# Patient Record
Sex: Female | Born: 1965 | Race: White | Hispanic: No | Marital: Married
Health system: Southern US, Community
[De-identification: ages and names within clinical notes are randomized; demographics above are authoritative.]

---

## 2015-05-18 ENCOUNTER — Emergency Department (HOSPITAL_COMMUNITY)
Admission: EM | Admit: 2015-05-18 | Discharge: 2015-05-18 | Disposition: A | Discharge status: Home / Self Care | Payer: Worker's Compensation | Attending: Emergency Medicine | Admitting: Emergency Medicine

## 2015-05-18 ENCOUNTER — Encounter (HOSPITAL_COMMUNITY): Payer: Self-pay | Admitting: *Deleted

## 2015-05-18 ENCOUNTER — Emergency Department (HOSPITAL_COMMUNITY): Payer: Worker's Compensation

## 2015-05-18 DIAGNOSIS — Y9389 Activity, other specified: Secondary | ICD-10-CM | POA: Diagnosis not present

## 2015-05-18 DIAGNOSIS — S20212A Contusion of left front wall of thorax, initial encounter: Secondary | ICD-10-CM | POA: Insufficient documentation

## 2015-05-18 DIAGNOSIS — Z792 Long term (current) use of antibiotics: Secondary | ICD-10-CM | POA: Diagnosis not present

## 2015-05-18 DIAGNOSIS — Z79899 Other long term (current) drug therapy: Secondary | ICD-10-CM | POA: Diagnosis not present

## 2015-05-18 DIAGNOSIS — W1839XA Other fall on same level, initial encounter: Secondary | ICD-10-CM | POA: Diagnosis not present

## 2015-05-18 DIAGNOSIS — S81012A Laceration without foreign body, left knee, initial encounter: Secondary | ICD-10-CM | POA: Insufficient documentation

## 2015-05-18 DIAGNOSIS — S299XXA Unspecified injury of thorax, initial encounter: Secondary | ICD-10-CM | POA: Diagnosis present

## 2015-05-18 DIAGNOSIS — Y990 Civilian activity done for income or pay: Secondary | ICD-10-CM | POA: Insufficient documentation

## 2015-05-18 DIAGNOSIS — Y9289 Other specified places as the place of occurrence of the external cause: Secondary | ICD-10-CM | POA: Diagnosis not present

## 2015-05-18 MED ORDER — OXYCODONE-ACETAMINOPHEN 5-325 MG PO TABS
2.0000 | ORAL_TABLET | Freq: Once | ORAL | Status: AC
  Administered 2015-05-18: 2 via ORAL
  Filled 2015-05-18: qty 2

## 2015-05-18 MED ORDER — ONDANSETRON 4 MG PO TBDP
4.0000 mg | ORAL_TABLET | Freq: Once | ORAL | Status: AC
  Administered 2015-05-18: 4 mg via ORAL
  Filled 2015-05-18: qty 1

## 2015-05-18 MED ORDER — OXYCODONE-ACETAMINOPHEN 5-325 MG PO TABS
2.0000 | ORAL_TABLET | ORAL | Status: AC | PRN
Start: 2015-05-18 — End: ?

## 2015-05-18 NOTE — ED Notes (Signed)
Pt is in stable condition upon d/c and is escorted from ED via wheelchair. 

## 2015-05-18 NOTE — Discharge Instructions (Signed)
Percocet for pain.  Use spirometer 10 times each hour while awake.    Chest Contusion A contusion is a deep bruise. Bruises happen when an injury causes bleeding under the skin. Signs of bruising include pain, puffiness (swelling), and discolored skin. The bruise may turn blue, purple, or yellow.  HOME CARE  Put ice on the injured area.  Put ice in a plastic bag.  Place a towel between the skin and the bag.  Leave the ice on for 15-20 minutes at a time, 03-04 times a day for the first 48 hours.  Only take medicine as told by your doctor.  Rest.  Take deep breaths (deep-breathing exercises) as told by your doctor.  Stop smoking if you smoke.  Do not lift objects over 5 pounds (2.3 kilograms) for 3 days or longer if told by your doctor. GET HELP RIGHT AWAY IF:   You have more bruising or puffiness.  You have pain that gets worse.  You have trouble breathing.  You are dizzy, weak, or pass out (faint).  You have blood in your pee (urine) or poop (stool).  You cough up or throw up (vomit) blood.  Your puffiness or pain is not helped with medicines. MAKE SURE YOU:   Understand these instructions.  Will watch your condition.  Will get help right away if you are not doing well or get worse.   This information is not intended to replace advice given to you by your health care provider. Make sure you discuss any questions you have with your health care provider.   Document Released: 11/06/2007 Document Revised: 02/12/2012 Document Reviewed: 11/11/2011 Elsevier Interactive Patient Education [employer]

## 2015-05-18 NOTE — ED Notes (Signed)
Lac on left knee cleaned and bandaged. Pt given incentive spirometer.

## 2015-05-18 NOTE — ED Notes (Signed)
Pt arrives from work following a fall over a pallet. Pt states she is having severe left rib pain and some soreness on her left knee.

## 2015-05-18 NOTE — ED Provider Notes (Signed)
CSN: [id number]     Arrival date & time 05/18/15  [id number] History   First MD Initiated Contact with Patient 05/18/15 1001     Chief Complaint  Patient presents with  . Fall     HPI  She presents for evaluation after a fall at work. He stepped awkwardly onto the edge of a pallet and fell onto the palate. Has a laceration and pain in her left knee. Has pain in her left lower ribs. Pain is pleuritic. Sharp. Denies any crepitus. No shortness of breath or pain with breathing. No abdominal pain. No injury to head neck or back.  History reviewed. No pertinent past medical history. History reviewed. No pertinent past surgical history. History reviewed. No pertinent family history. Social History  Substance Use Topics  . Smoking status: Unknown If Ever Smoked  . Smokeless tobacco: None  . Alcohol Use: No   OB History    No data available     Review of Systems  Constitutional: Negative for fever, chills, diaphoresis, appetite change and fatigue.  HENT: Negative for mouth sores, sore throat and trouble swallowing.   Eyes: Negative for visual disturbance.  Respiratory: Negative for cough, chest tightness, shortness of breath and wheezing.   Cardiovascular: Positive for chest pain.  Gastrointestinal: Negative for nausea, vomiting, abdominal pain, diarrhea and abdominal distention.  Endocrine: Negative for polydipsia, polyphagia and polyuria.  Genitourinary: Negative for dysuria, frequency and hematuria.  Musculoskeletal: Negative for gait problem.       Laceration to the left knee. No limp.  Skin: Negative for color change, pallor and rash.  Neurological: Negative for dizziness, syncope, light-headedness and headaches.  Hematological: Does not bruise/bleed easily.  Psychiatric/Behavioral: Negative for behavioral problems and confusion.      Allergies  Contrast media  Home Medications   Prior to Admission medications   Medication Sig Start Date End Date Taking? Authorizing Provider    azithromycin (ZITHROMAX) 250 MG tablet Take 250 mg by mouth daily.   Yes Historical Provider, MD  BLACK COHOSH PO Take 1 tablet by mouth daily.   Yes Historical Provider, MD  ibuprofen (ADVIL,MOTRIN) 800 MG tablet Take 800 mg by mouth every 8 (eight) hours as needed.   Yes Historical Provider, MD  ranitidine (ZANTAC) 150 MG tablet Take 150 mg by mouth daily.   Yes Historical Provider, MD  oxyCODONE-acetaminophen (PERCOCET/ROXICET) 5-325 MG tablet Take 2 tablets by mouth every 4 (four) hours as needed. 05/18/15   Rolland PorterMark Zaylyn Bergdoll, MD   BP 104/69 mmHg  Pulse 65  Temp(Src) 98.2 F (36.8 C) (Oral)  Resp 18  SpO2 96% Physical Exam  Constitutional: She is oriented to person, place, and time. She appears well-developed and well-nourished. No distress.  HENT:  Head: Normocephalic.  Eyes: Conjunctivae are normal. Pupils are equal, round, and reactive to light. No scleral icterus.  Neck: Normal range of motion. Neck supple. No thyromegaly present.  Cardiovascular: Normal rate and regular rhythm.  Exam reveals no gallop and no friction rub.   No murmur heard. Pulmonary/Chest: Effort normal and breath sounds normal. No respiratory distress. She has no wheezes. She has no rales.    Abdominal: Soft. Bowel sounds are normal. She exhibits no distension. There is no tenderness. There is no rebound.  Musculoskeletal: Normal range of motion.       Legs: Neurological: She is alert and oriented to person, place, and time.  Skin: Skin is warm and dry. No rash noted.  Psychiatric: She has a normal mood and affect.  Her behavior is normal.    ED Course  Procedures (including critical care time) Labs Review Labs Reviewed - No data to display  Imaging Review Dg Ribs Unilateral W/chest Left  05/18/2015  CLINICAL DATA:  Pain following fall down stairs EXAM: LEFT RIBS AND CHEST - 3+ VIEW COMPARISON:  None. FINDINGS: Frontal chest as well as oblique and cone-down lower rib images were obtained. Lungs are clear.  Heart size and pulmonary vascularity are normal. No adenopathy. There is no pneumothorax or effusion. There is no apparent rib fracture. IMPRESSION: No demonstrable rib fracture.  No pneumothorax.  Lungs clear. Electronically Signed   By: Bretta Bang III M.D.   On: 05/18/2015 11:38   I have personally reviewed and evaluated these images and lab results as part of my medical decision-making.   EKG Interpretation None      MDM   Final diagnoses:  Chest wall contusion, left, initial encounter    Abdomen is dressed. X-ray show no rib fracture. No pelvic contusion, or pneumothorax. Plan is home. Instructed in the use of an incentive spirometer. Recheck with any worsening or new symptoms.    Rolland Porter, MD 05/18/15 [phone]

## 2017-05-19 IMAGING — DX DG RIBS W/ CHEST 3+V*L*
3 series · 3 of 3 positions shown · non-contrast
Comparison: None.

CLINICAL DATA: Pain following fall down stairs

EXAM:
LEFT RIBS AND CHEST - 3+ VIEW

[w chest pa]
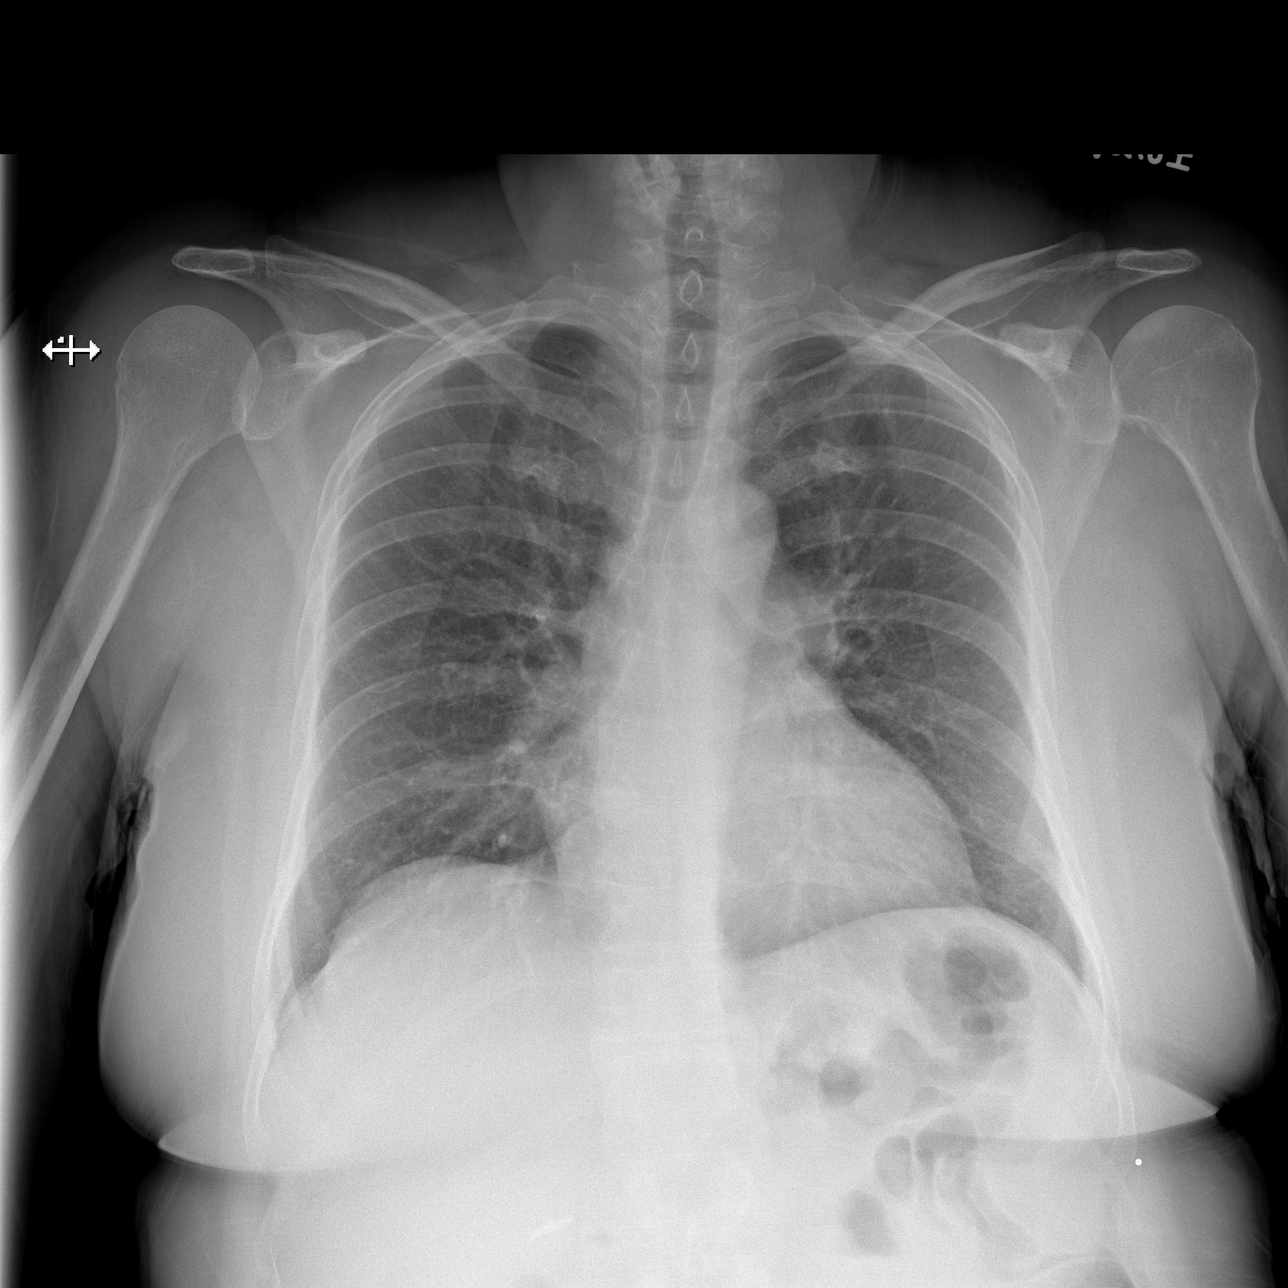

[w ribs ap lower left]
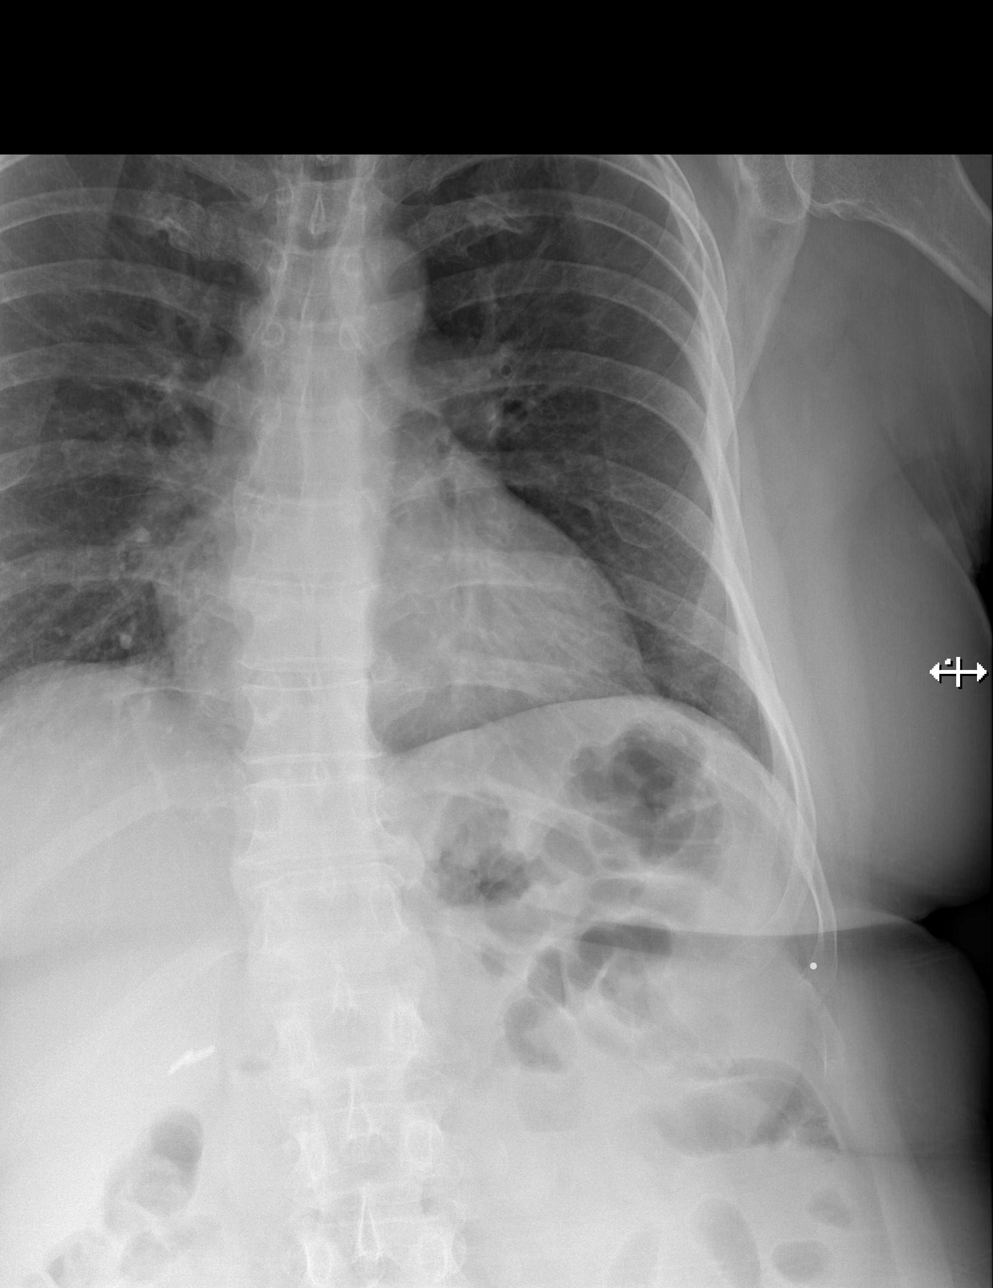

[w ribs obl left]
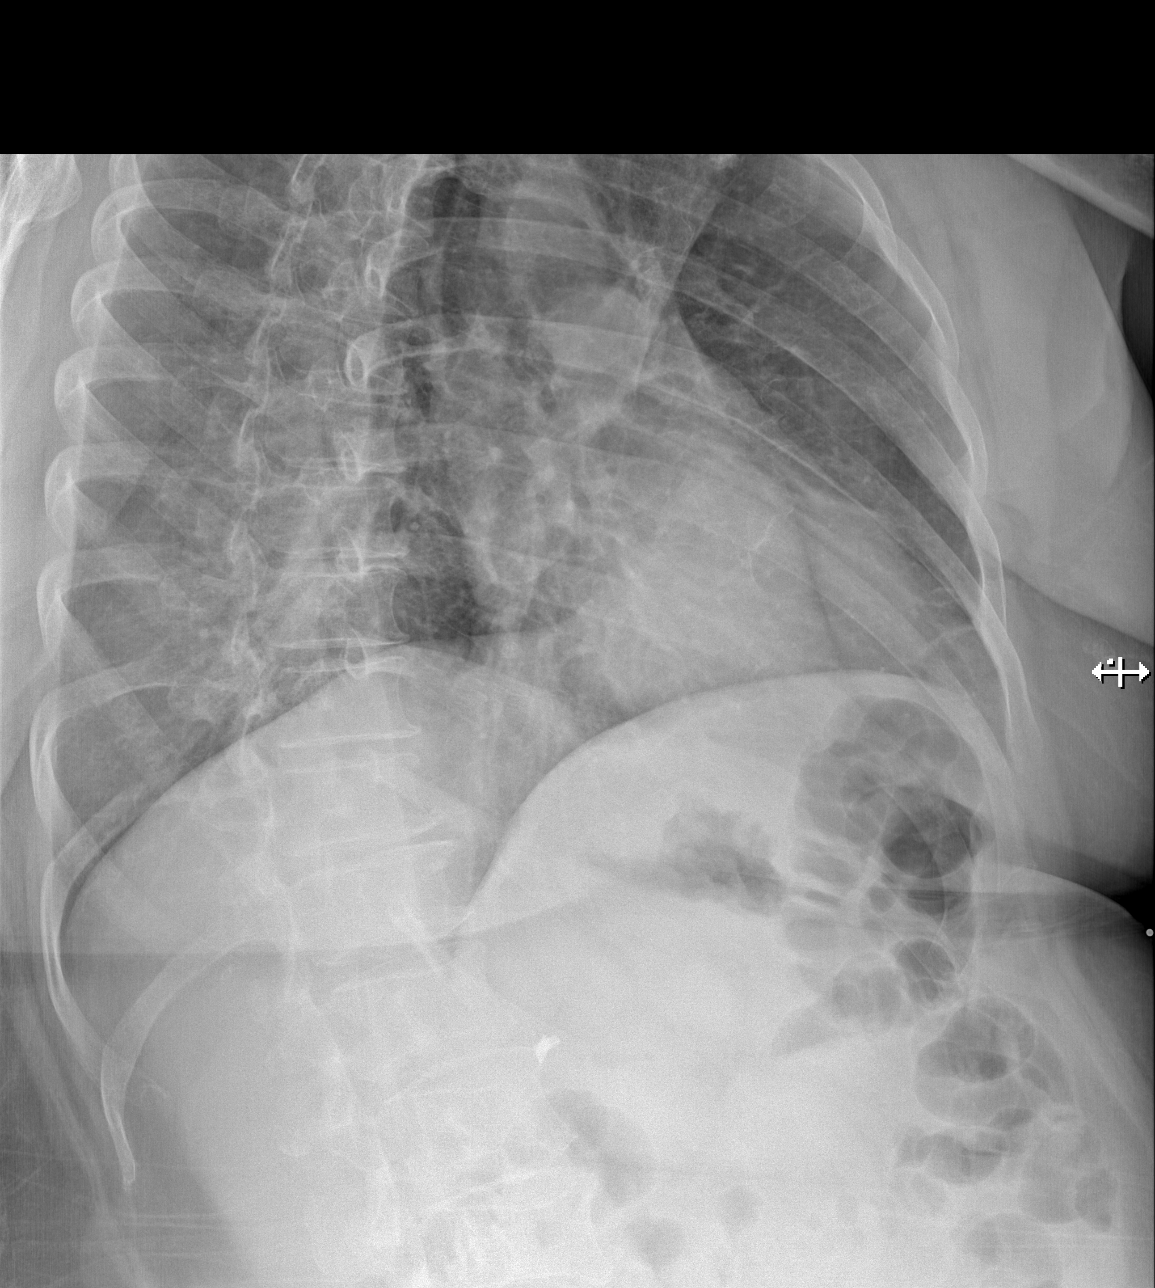

[3 of 3 positions shown; findings below may reference images not displayed]

FINDINGS: Frontal chest as well as oblique and cone-down lower rib images were
obtained. Lungs are clear. Heart size and pulmonary vascularity are
normal. No adenopathy. There is no pneumothorax or effusion. There
is no apparent rib fracture.
IMPRESSION: No demonstrable rib fracture.  No pneumothorax.  Lungs clear.

## 2020-05-26 DIAGNOSIS — I517 Cardiomegaly: Secondary | ICD-10-CM

## 2020-08-01 DEATH — deceased
